# Patient Record
Sex: Female | Born: 2000 | Race: White | Hispanic: No | Marital: Single | Smoking: Current every day smoker
Health system: Southern US, Community
[De-identification: ages and names within clinical notes are randomized; demographics above are authoritative.]

## PROBLEM LIST (undated history)

## (undated) DIAGNOSIS — Q893 Situs inversus: Secondary | ICD-10-CM

## (undated) DIAGNOSIS — G43909 Migraine, unspecified, not intractable, without status migrainosus: Secondary | ICD-10-CM

## (undated) DIAGNOSIS — Q240 Dextrocardia: Secondary | ICD-10-CM

## (undated) HISTORY — PX: OTHER SURGICAL HISTORY: SHX169

## (undated) HISTORY — PX: NO PAST SURGERIES: SHX2092

---
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
## 2022-02-12 ENCOUNTER — Encounter: Payer: Self-pay | Admitting: Obstetrics

## 2022-02-12 ENCOUNTER — Ambulatory Visit (INDEPENDENT_AMBULATORY_CARE_PROVIDER_SITE_OTHER): Payer: Medicaid Other | Admitting: Obstetrics

## 2022-02-12 VITALS — BP 106/80 | Ht 65.0 in | Wt 120.0 lb

## 2022-02-12 DIAGNOSIS — Z3040 Encounter for surveillance of contraceptives, unspecified: Secondary | ICD-10-CM | POA: Diagnosis not present

## 2022-02-12 DIAGNOSIS — N921 Excessive and frequent menstruation with irregular cycle: Secondary | ICD-10-CM

## 2022-02-12 DIAGNOSIS — Z975 Presence of (intrauterine) contraceptive device: Secondary | ICD-10-CM | POA: Diagnosis not present

## 2022-02-12 DIAGNOSIS — A749 Chlamydial infection, unspecified: Secondary | ICD-10-CM | POA: Diagnosis not present

## 2022-02-12 DIAGNOSIS — Z113 Encounter for screening for infections with a predominantly sexual mode of transmission: Secondary | ICD-10-CM | POA: Diagnosis not present

## 2022-02-12 NOTE — Patient Instructions (Addendum)
Have a great year! Please call with any concerns. Don't forget to wear your seatbelt everyday! If you are not signed up on MyChart, please ask [place] how to sign up for it!   In a world where you can be anything, please be kind.   Most forms of birth control do not protect you from sexually transmitted infections (STIs). To protect against STIs, always use a latex condom.  Body mass index is 19.97 kg/m.  A Healthy Lifestyle: Care Instructions Your Care Instructions  A healthy lifestyle can help you feel good, stay at a healthy weight, and have plenty of energy for both work and play. A healthy lifestyle is something you can share with your whole family. A healthy lifestyle also can lower your risk for serious health problems, such as high blood pressure, heart disease, and diabetes. You can follow a few steps listed below to improve your health and the health of your family. Follow-up care is a key part of your treatment and safety. Be sure to make and go to all appointments, and call your doctor if you are having problems. It's also a good idea to know your test results and keep a list of the medicines you take. How can you care for yourself at home? Do not eat too much sugar, fat, or fast foods. You can still have dessert and treats now and then. The goal is moderation. Start small to improve your eating habits. Pay attention to portion sizes, drink less juice and soda pop, and eat more fruits and vegetables. Eat a healthy amount of food. A 3-ounce serving of meat, for example, is about the size of a deck of cards. Fill the rest of your plate with vegetables and whole grains. Limit the amount of soda and sports drinks you have every day. Drink more water when you are thirsty. Eat at least 5 servings of fruits and vegetables every day. It may seem like a lot, but it is not hard to reach this goal. A serving or helping is 1 piece of fruit, 1 cup of vegetables, or 2 cups of leafy, raw vegetables.  Have an apple or some carrot sticks as an afternoon snack instead of a candy bar. Try to have fruits and/or vegetables at every meal. Make exercise part of your daily routine. You may want to start with simple activities, such as walking, bicycling, or slow swimming. Try to be active 30 to 60 minutes every day. You do not need to do all 30 to 60 minutes all at once. For example, you can exercise 3 times a day for 10 or 20 minutes. Moderate exercise is safe for most people, but it is always a good idea to talk to your doctor before starting an exercise program. Keep moving. Mow the lawn, work in the garden, or [employer]. Take the stairs instead of the elevator at work. If you smoke, quit. People who smoke have an increased risk for heart attack, stroke, cancer, and other lung illnesses. Quitting is hard, but there are ways to boost your chance of quitting tobacco for good. Use nicotine gum, patches, or lozenges. Ask your doctor about stop-smoking programs and medicines. Keep trying. In addition to reducing your risk of diseases in the future, you will notice some benefits soon after you stop using tobacco. If you have shortness of breath or asthma symptoms, they will likely get better within a few weeks after you quit. Limit how much alcohol you drink. Moderate amounts of  alcohol (up to 2 drinks a day for men, 1 drink a day for women) are okay. But drinking too much can lead to liver problems, high blood pressure, and other health problems. Family health If you have a family, there are many things you can do together to improve your health. Eat meals together as a family as often as possible. Eat healthy foods. This includes fruits, vegetables, lean meats and dairy, and whole grains. Include your family in your fitness plan. Most people think of activities such as jogging or tennis as the way to fitness, but there are many ways you and your family can be more active. Anything that makes you  breathe hard and gets your heart pumping is exercise. Here are some tips: Walk to do errands or to take your child to school or the bus. Go for a family bike ride after dinner instead of watching TV. Care instructions adapted under license by your healthcare professional. This care instruction is for use with your licensed healthcare professional. If you have questions about a medical condition or this instruction, always ask your healthcare professional. [employer] any warranty or liability for your use of this information.

## 2022-02-12 NOTE — Progress Notes (Signed)
Chief Complaint  Patient presents with   Exposure to STD    Pt wants STD check.    Patient Monica [name] is an 21 y.o. year old G0P0000 No LMP recorded. Patient has had an implant. currently Nexplanon and orthoNOvum  for contraception who presents for STI check. She denies domestic violence or sexual abuse.  Patient denies vaginal discharge, odor, burning or itching. She reports she is using both contraceptives bc she would have continuosu break through bleeding with nexplanon and the pill was added for bleeding. Every time she stops bleeding recurs. Pt present with mother for visit, who answers most of questions.  Review of Systems  Constitutional:  Negative for activity change, appetite change, chills, diaphoresis, fatigue, fever and unexpected weight change.  HENT:  Negative for congestion, dental problem, drooling, ear discharge, ear pain, facial swelling, hearing loss, mouth sores, nosebleeds, postnasal drip, rhinorrhea, sinus pressure, sinus pain, sneezing, sore throat, tinnitus, trouble swallowing and voice change.   Eyes:  Negative for photophobia, pain, discharge, redness, itching and visual disturbance.  Respiratory:  Negative for apnea, cough, choking, chest tightness, shortness of breath, wheezing and stridor.   Cardiovascular:  Negative for chest pain, palpitations and leg swelling.  Gastrointestinal:  Negative for abdominal distention, abdominal pain, anal bleeding, blood in stool, constipation, diarrhea, nausea, rectal pain and vomiting.  Endocrine: Negative for cold intolerance, heat intolerance, polydipsia, polyphagia and polyuria.  Genitourinary:  Negative for decreased urine volume, difficulty urinating, dyspareunia, dysuria, enuresis, flank pain, frequency, genital sores, hematuria, menstrual problem, pelvic pain, urgency, vaginal bleeding, vaginal discharge and vaginal pain.  Musculoskeletal:  Negative for arthralgias, back pain, gait problem, joint swelling, myalgias, neck  pain and neck stiffness.  Skin:  Negative for color change, pallor, rash and wound.  Allergic/Immunologic: Negative for environmental allergies, food allergies and immunocompromised state.  Neurological:  Negative for dizziness, tremors, seizures, syncope, facial asymmetry, speech difficulty, weakness, light-headedness, numbness and headaches.  Hematological:  Negative for adenopathy. Does not bruise/bleed easily.  Psychiatric/Behavioral:  Negative for agitation, behavioral problems, confusion, decreased concentration, dysphoric mood, hallucinations, self-injury, sleep disturbance and suicidal ideas. The patient is not nervous/anxious and is not hyperactive.    Period Cycle (Days):  (amenorrhea on contraceptive) Dysmenorrhea: None  Past Medical History:  Diagnosis Date   Dextrocardia    Migraines    Situs inversus abdominalis    Past Surgical History:  Procedure Laterality Date   NO PAST SURGERIES     Family History  Problem Relation Age of Onset   Healthy Mother    Healthy Father    Healthy Sister    Healthy Brother    Early death Paternal Uncle    Social History   Socioeconomic History   Marital status: Single    Spouse name: Not on file   Number of children: Not on file   Years of education: Not on file   Highest education level: Not on file  Occupational History   Not on file  Tobacco Use   Smoking status: Never   Smokeless tobacco: Never  Vaping Use   Vaping Use: Every day  Substance and Sexual Activity   Alcohol use: No    Alcohol/week: 0.0 standard drinks   Drug use: No   Sexual activity: Yes    Birth control/protection: Implant, Pill  Other Topics Concern   Not on file  Social History Narrative   Not on file   Social Determinants of Health   Financial Resource Strain: Not on file  Food Insecurity:  Not on file  Transportation Needs: Not on file  Physical Activity: Not on file  Stress: Not on file  Social Connections: Not on file  Intimate Partner  Violence: Not on file   Medicine list and allergies reviewed and updated.    BP 106/80   Ht '5\' 5"'$  (1.651 m)   Wt 120 lb (54.4 kg)   BMI 19.97 kg/m  Physical Exam Vitals and nursing note reviewed.  Constitutional:      Appearance: Normal appearance.  HENT:     Head: Normocephalic and atraumatic.  Eyes:     Extraocular Movements: Extraocular movements intact.  Pulmonary:     Effort: Pulmonary effort is normal.  Musculoskeletal:        General: Normal range of motion.     Cervical back: Normal range of motion.  Skin:    General: Skin is warm and dry.  Neurological:     General: No focal deficit present.     Mental Status: She is alert and oriented to person, place, and time.  Psychiatric:        Mood and Affect: Mood normal.        Behavior: Behavior normal.        Thought Content: Thought content normal.   Assessment and Plan: Routine screening for STI (sexually transmitted infection) - Plan: Chlamydia/Gonococcus/Trichomonas, NAA  Surveillance of contraceptive implant  Breakthrough bleeding on Nexplanon  STI panel obtained via urine cultures Patient declines bloodwork Safe sexual practices encouraged. We have discussed that patient should be only nexplanon OR pill. They are not to be used together long term as that is increasing risk for clots/MI/Stroke. If the nexplanon is not working she should remove it and just use the pill if desired. Also Discussed that in general patient should be on lowest amount of estrogen possible to limit side effects, where as now she is on a 35 mcg pill which is the highest option possible.  They will discuss option and follow back up with [place].   Return if symptoms worsen or fail to improve, for annual/well woman.

## 2022-02-15 LAB — CHLAMYDIA/GONOCOCCUS/TRICHOMONAS, NAA
Chlamydia by NAA: POSITIVE — AB
Gonococcus by NAA: NEGATIVE
Trich vag by NAA: NEGATIVE

## 2022-02-16 ENCOUNTER — Telehealth: Payer: Self-pay

## 2022-02-16 DIAGNOSIS — A749 Chlamydial infection, unspecified: Secondary | ICD-10-CM

## 2022-02-16 MED ORDER — AZITHROMYCIN 1 G PO PACK: 1.0000 g | PACK | Freq: Once | ORAL | 0 refills | Status: AC

## 2022-02-16 NOTE — Telephone Encounter (Signed)
Pt's mom calling; has seen results one of which is positive; is rx needed?  Carlton, mom; she states pt is able to take azithromycin 1gm; pharm verified; adv will send in rx; no IC for 7d; use condome; partner needs tx - to see his PCP or HD; toc in 4wks.

## 2022-02-17 MED ORDER — DOXYCYCLINE HYCLATE 100 MG PO CAPS: 100.0000 mg | ORAL_CAPSULE | Freq: Two times a day (BID) | ORAL | 0 refills | Status: DC

## 2022-02-17 NOTE — Addendum Note (Signed)
Addended by: Heidi Dach on: 02/17/2022 10:27 AM   Modules accepted: Orders

## 2022-02-17 NOTE — Progress Notes (Signed)
Pt aware and April reached out to schedule her 6 week f/up.

## 2022-02-22 NOTE — Telephone Encounter (Signed)
Patient is schedule for 04/01/22 with [facility]

## 2022-03-15 ENCOUNTER — Other Ambulatory Visit: Payer: Self-pay | Admitting: Obstetrics and Gynecology

## 2022-03-18 ENCOUNTER — Ambulatory Visit: Payer: Medicaid Other | Admitting: Physician Assistant

## 2022-03-19 ENCOUNTER — Encounter: Payer: Self-pay | Admitting: Advanced Practice Midwife

## 2022-03-19 ENCOUNTER — Ambulatory Visit (INDEPENDENT_AMBULATORY_CARE_PROVIDER_SITE_OTHER): Payer: Medicaid Other | Admitting: Advanced Practice Midwife

## 2022-03-19 VITALS — BP 90/60 | Ht 65.0 in | Wt 115.0 lb

## 2022-03-19 DIAGNOSIS — Z3041 Encounter for surveillance of contraceptive pills: Secondary | ICD-10-CM

## 2022-03-19 DIAGNOSIS — Z3046 Encounter for surveillance of implantable subdermal contraceptive: Secondary | ICD-10-CM

## 2022-03-19 MED ORDER — NORETHIN ACE-ETH ESTRAD-FE 1-20 MG-MCG PO TABS: 1.0000 | ORAL_TABLET | Freq: Every day | ORAL | 11 refills | Status: DC

## 2022-03-19 NOTE — Progress Notes (Signed)
   GYNECOLOGY PROCEDURE NOTE  Nexplanon removal discussed in detail.  Risks of infection, bleeding, nerve injury all reviewed.  Patient understands risks and desires to proceed.  Verbal consent obtained.  Patient is certain she wants the Nexplanon removed.  She has had irregular and heavy bleeding while on Nexplanon. She was advised to use only one form of birth control to reduce risk of blood clots. See note from 02/12/22. We discussed all of that in more detail today and the increased risk for blood clots when using estrogen while using tobacco products. She prefers to continue with OCP and remove Nexplanon. She is encouraged to quit vaping. OCP changed to low dose estrogen. All questions answered.  Review of Systems  Constitutional:  Negative for chills and fever.  HENT:  Negative for congestion, ear discharge, ear pain, hearing loss, sinus pain and sore throat.   Eyes:  Negative for blurred vision and double vision.  Respiratory:  Negative for cough, shortness of breath and wheezing.   Cardiovascular:  Negative for chest pain, palpitations and leg swelling.  Gastrointestinal:  Negative for abdominal pain, blood in stool, constipation, diarrhea, heartburn, melena, nausea and vomiting.  Genitourinary:  Negative for dysuria, flank pain, frequency, hematuria and urgency.  Musculoskeletal:  Negative for back pain, joint pain and myalgias.  Skin:  Negative for itching and rash.  Neurological:  Negative for dizziness, tingling, tremors, sensory change, speech change, focal weakness, seizures, loss of consciousness, weakness and headaches.  Endo/Heme/Allergies:  Negative for environmental allergies. Does not bruise/bleed easily.       Positive for irregular/heavy bleeding  Psychiatric/Behavioral:  Negative for depression, hallucinations, memory loss, substance abuse and suicidal ideas. The patient is not nervous/anxious and does not have insomnia.    Vital Signs: BP 90/60   Ht '5\' 5"'$  (1.651 m)   Wt  115 lb (52.2 kg)   BMI 19.14 kg/m  Constitutional: Well nourished, well developed female in no acute distress.  HEENT: normal Skin: Warm and dry.  Extremity:  no edema   Respiratory:  Normal respiratory effort Neuro: DTRs 2+, Cranial nerves grossly intact Psych: Alert and Oriented x3. No memory deficits. Normal mood and affect.    Procedure: Patient placed in dorsal supine with left arm above head, elbow flexed at 90 degrees, arm resting on examination table.  Nexplanon identified without problems.  Betadine scrub x3.  1.5 ml of 1% lidocaine injected under Nexplanon device without problems.  Sterile gloves applied.  Small 0.5cm incision made at distal tip of Nexplanon device with 11 blade scalpel.  Nexplanon brought to incision and grasped with a small kelly clamp.  Nexplanon removed intact without problems.  Pressure applied to incision.  Hemostasis obtained.  Steri-strips applied, followed by bandage and compression dressing.  Patient tolerated procedure well.  No complications.   Assessment: 21 y.o. year old female now s/p uncomplicated Nexplanon removal.  Plan: 1.  Patient given post procedure precautions and asked to call for fever, chills, redness or drainage from her incision, bleeding from incision.  She understands she will likely have a small bruise near site of removal and can remove bandage tomorrow and steri-strips in approximately 1 week.  2) Contraception: Rx Tami Ribas, CNM [facility] 03/19/22, 2:12 PM    [id number] for lidocaine block, [phone] for nexplanon removal

## 2022-03-19 NOTE — Patient Instructions (Addendum)
Hormonal Contraception Information Hormonal contraception is a type of birth control that uses hormones to prevent pregnancy. It usually involves a combination of the hormones estrogen and progesterone, or only the hormone progesterone. Hormonal contraception works in these ways: It thickens the mucus in the cervix, which is the lowest part of the uterus. Thicker mucus makes it harder for sperm to enter the uterus. It changes the lining of the uterus. This makes it harder for an egg to attach or implant. It may stop the ovaries from releasing eggs (ovulation). Some women who take hormonal contraceptives that contain only progesterone may continue to ovulate. Hormonal contraception cannot prevent STIs (sexually transmitted infections). Pregnancy may still occur. Types of hormonal contraception  Estrogen and progesterone contraceptives Contraceptives that use a combination of estrogen and progesterone are available in these forms: Pill. Pills come in different combinations of hormones. Pills must be taken at the same time each day. They can affect your period. You can get your period monthly, once every 3 months, or not at all. Patch. The patch is applied to the buttocks, abdomen, upper outer arm, or back. It is kept in place for 3 weeks. It is removed for the last or fourth week of the cycle. Vaginal ring. The ring is placed in the vagina and left there for 3 weeks. It is then removed for the last or fourth week of the cycle. Progesterone-only contraceptives Contraceptives that use only progesterone are available in these forms: Pill. Pills should be taken at the same time everyday. This is very important to decrease the chance of pregnancy. Pills containing progestin-only are usually taken every day of the cycle. Other types of pills may have a placebo tablet for the last 4 days of every cycle. Intrauterine device (IUD). This device is inserted through the vagina and cervix into the uterus. It is  removed or replaced every 3 to 5 years, depending on the type. It can be removed sooner. Implant. Plastic rods are placed under the skin of the upper arm. They are removed or replaced every 3 years. They can be removed sooner. Shot (injection). The injection is given once every 12 or 13 weeks (about 3 months). Risks associated with hormonal contraception Estrogen and progesterone contraceptives can sometimes cause side effects, such as: Nausea. Headaches. Breast tenderness. Bleeding or spotting between menstrual cycles. High blood pressure (rare). Strokes, heart attacks, or blood clots (rare). Progesterone-only contraceptives also can have side effects, such as: Nausea. Headaches. Breast tenderness. Irregular menstrual bleeding. High blood pressure (rare). Talk to your health care provider about what side effects may mean for you. Questions to ask: What type of hormonal contraception is right for me? How long should I plan to use hormonal contraception? What are the side effects of the hormonal contraception method I choose? How can I prevent STIs while using hormonal contraception? Where to find more information Ask your health care provider for more information and resources about hormonal contraception. You can also go to: U.S. Department of Health and [employer], Office on Women's Health: [place] Summary Estrogen and progesterone are hormones used in many forms of birth control. Hormonal contraception cannot prevent STIs (sexually transmitted infections). Talk to your health care provider about what side effects may mean for you. Ask your health care provider for more information and resources about hormonal contraception. This information is not intended to replace advice given to you by your health care provider. Make sure you discuss any questions you have with your health care provider.  Document Revised: 05/14/2020 Document Reviewed: 05/14/2020 Elsevier  Patient Education  [place] of Quitting Smoking Quitting smoking is a physical and mental challenge. You may have cravings, withdrawal symptoms, and temptation to smoke. Before quitting, work with your health care provider to make a plan that can help you manage quitting. Making a plan before you quit may keep you from smoking when you have the urge to smoke while trying to quit. How to manage lifestyle changes Managing stress Stress can make you want to smoke, and wanting to smoke may cause stress. It is important to find ways to manage your stress. You could try some of the following: Practice relaxation techniques. Breathe slowly and deeply, in through your nose and out through your mouth. Listen to music. Soak in a bath or take a shower. Imagine a peaceful place or vacation. Get some support. Talk with family or friends about your stress. Join a support group. Talk with a counselor or therapist. Get some physical activity. Go for a walk, run, or bike ride. Play a favorite sport. Practice yoga.  Medicines Talk with your health care provider about medicines that might help you deal with cravings and make quitting easier for you. Relationships Social situations can be difficult when you are quitting smoking. To manage this, you can: Avoid parties and other social situations where people might be smoking. Avoid alcohol. Leave right away if you have the urge to smoke. Explain to your family and friends that you are quitting smoking. Ask for support and let them know you might be a bit grumpy. Plan activities where smoking is not an option. General instructions Be aware that many people gain weight after they quit smoking. However, not everyone does. To keep from gaining weight, have a plan in place before you quit, and stick to the plan after you quit. Your plan should include: Eating healthy snacks. When you have a craving, it may help to: Eat  popcorn, or try carrots, celery, or other cut vegetables. Chew sugar-free gum. Changing how you eat. Eat small portion sizes at meals. Eat 4-6 small meals throughout the day instead of 1-2 large meals a day. Be mindful when you eat. You should avoid watching television or doing other things that might distract you as you eat. Exercising regularly. Make time to exercise each day. If you do not have time for a long workout, do short bouts of exercise for 5-10 minutes several times a day. Do some form of strengthening exercise, such as weight lifting. Do some exercise that gets your heart beating and causes you to breathe deeply, such as walking fast, running, swimming, or biking. This is very important. Drinking plenty of water or other low-calorie or no-calorie drinks. Drink enough fluid to keep your urine pale yellow.  How to recognize withdrawal symptoms Your body and mind may experience discomfort as you try to get used to not having nicotine in your system. These effects are called withdrawal symptoms. They may include: Feeling hungrier than normal. Having trouble concentrating. Feeling irritable or restless. Having trouble sleeping. Feeling depressed. Craving a cigarette. These symptoms may surprise you, but they are normal to have when quitting smoking. To manage withdrawal symptoms: Avoid places, people, and activities that trigger your cravings. Remember why you want to quit. Get plenty of sleep. Avoid coffee and other drinks that contain caffeine. These may worsen some of your symptoms. How to manage cravings Come up with a plan for how to deal with  your cravings. The plan should include the following: A definition of the specific situation you want to deal with. An activity or action you will take to replace smoking. A clear idea for how this action will help. The name of someone who could help you with this. Cravings usually last for 5-10 minutes. Consider taking the  following actions to help you with your plan to deal with cravings: Keep your mouth busy. Chew sugar-free gum. Suck on hard candies or a straw. Brush your teeth. Keep your hands and body busy. Change to a different activity right away. Squeeze or play with a ball. Do an activity or a hobby, such as making bead jewelry, practicing needlepoint, or working with wood. Mix up your normal routine. Take a short exercise break. Go for a quick walk, or run up and down stairs. Focus on doing something kind or helpful for someone else. Call a friend or family member to talk during a craving. Join a support group. Contact a quitline. Where to find support To get help or find a support group: Call the [facility]'s Smoking Quitline: 1-800-QUIT-NOW 213-592-9639) Text QUIT to SmokefreeTXT: 673419 Where to find more information Visit these websites to find more information on quitting smoking: U.S. Department of Health and Human Services: www.smokefree.gov American Lung Association: www.freedomfromsmoking.org Centers for Disease Control and Prevention (CDC): http://www.wolf.info/ American Heart Association: www.heart.org Contact a health care provider if: You want to change your plan for quitting. The medicines you are taking are not helping. Your eating feels out of control or you cannot sleep. You feel depressed or become very anxious. Summary Quitting smoking is a physical and mental challenge. You will face cravings, withdrawal symptoms, and temptation to smoke again. Preparation can help you as you go through these challenges. Try different techniques to manage stress, handle social situations, and prevent weight gain. You can deal with cravings by keeping your mouth busy (such as by chewing gum), keeping your hands and body busy, calling family or friends, or contacting a quitline for people who want to quit smoking. You can deal with withdrawal symptoms by avoiding places where people  smoke, getting plenty of rest, and avoiding drinks that contain caffeine. This information is not intended to replace advice given to you by your health care provider. Make sure you discuss any questions you have with your health care provider. Document Revised: 08/28/2021 Document Reviewed: 08/28/2021 Elsevier Patient Education  [place].

## 2022-03-25 ENCOUNTER — Ambulatory Visit: Payer: Medicaid Other | Admitting: Obstetrics & Gynecology

## 2022-04-01 ENCOUNTER — Ambulatory Visit: Payer: Medicaid Other | Admitting: Obstetrics

## 2022-04-22 ENCOUNTER — Other Ambulatory Visit (HOSPITAL_COMMUNITY)
Admission: RE | Admit: 2022-04-22 | Discharge: 2022-04-22 | Disposition: A | Discharge status: Home / Self Care | Payer: Medicaid Other | Source: Ambulatory Visit | Attending: Advanced Practice Midwife | Admitting: Advanced Practice Midwife

## 2022-04-22 ENCOUNTER — Ambulatory Visit (INDEPENDENT_AMBULATORY_CARE_PROVIDER_SITE_OTHER): Payer: Medicaid Other

## 2022-04-22 DIAGNOSIS — N898 Other specified noninflammatory disorders of vagina: Secondary | ICD-10-CM | POA: Diagnosis present

## 2022-04-22 DIAGNOSIS — Z8619 Personal history of other infectious and parasitic diseases: Secondary | ICD-10-CM | POA: Insufficient documentation

## 2022-04-22 NOTE — Progress Notes (Signed)
Pt came in today for nurse visit. Missed her TOC for + chlamydia in May. Now having vaginal d/c. Not sure if partner was treated. Not using condoms.

## 2022-04-26 LAB — CERVICOVAGINAL ANCILLARY ONLY
Bacterial Vaginitis (gardnerella): NEGATIVE
Candida Glabrata: NEGATIVE
Candida Vaginitis: NEGATIVE
Chlamydia: NEGATIVE
Comment: NEGATIVE
Comment: NEGATIVE
Comment: NEGATIVE
Comment: NEGATIVE
Comment: NEGATIVE
Comment: NORMAL
Neisseria Gonorrhea: NEGATIVE
Trichomonas: NEGATIVE

## 2022-04-28 ENCOUNTER — Encounter: Payer: Self-pay | Admitting: Obstetrics

## 2022-10-27 ENCOUNTER — Emergency Department
Admission: EM | Admit: 2022-10-27 | Discharge: 2022-10-27 | Disposition: A | Discharge status: Home / Self Care | Payer: Medicaid Other | Attending: Emergency Medicine | Admitting: Emergency Medicine

## 2022-10-27 ENCOUNTER — Other Ambulatory Visit: Payer: Self-pay

## 2022-10-27 DIAGNOSIS — J101 Influenza due to other identified influenza virus with other respiratory manifestations: Secondary | ICD-10-CM | POA: Insufficient documentation

## 2022-10-27 DIAGNOSIS — Z1152 Encounter for screening for COVID-19: Secondary | ICD-10-CM | POA: Insufficient documentation

## 2022-10-27 LAB — RESP PANEL BY RT-PCR (RSV, FLU A&B, COVID)  RVPGX2
Influenza A by PCR: NEGATIVE
Influenza B by PCR: POSITIVE — AB
Resp Syncytial Virus by PCR: NEGATIVE
SARS Coronavirus 2 by RT PCR: NEGATIVE

## 2022-10-27 MED ORDER — ACETAMINOPHEN 500 MG PO TABS
1000.0000 mg | ORAL_TABLET | Freq: Once | ORAL | Status: AC
  Administered 2022-10-27: 1000 mg via ORAL
  Filled 2022-10-27: qty 2

## 2022-10-27 NOTE — ED Notes (Signed)
Pt states headaches, fevers, chills, bodyaches, weakness, stuffiness, cough, and nausea/vomitting

## 2022-10-27 NOTE — ED Provider Notes (Signed)
[facility] Provider Note    Event Date/Time   First MD Initiated Contact with Patient 10/27/22 1217     (approximate)   History   Fever and flu like symptoms   HPI  Monica [name] is a 22 y.o. female   presents to the ED by mother with complaint of cough, congestion, body aches, headache and fever for 3 days.  Patient possibly was exposed to flu.  Mother states that she did a home test for COVID which was negative.  Patient is vomiting or diarrhea other than associated posttussive.  Patient does vape and has a history of migraines and dextrocardia      Physical Exam   Triage Vital Signs: ED Triage Vitals  Enc Vitals Group     BP 10/27/22 1153 124/89     Pulse Rate 10/27/22 1153 (!) 128     Resp 10/27/22 1153 16     Temp 10/27/22 1153 (!) 101.3 F (38.5 C)     Temp Source 10/27/22 1153 Oral     SpO2 10/27/22 1153 99 %     Weight 10/27/22 1154 115 lb (52.2 kg)     Height 10/27/22 1154 '5\' 6"'$  (1.676 m)     Head Circumference --      Peak Flow --      Pain Score 10/27/22 1154 9     Pain Loc --      Pain Edu? --      Excl. in [place]? --     Most recent vital signs: Vitals:   10/27/22 1153 10/27/22 1353  BP: 124/89   Pulse: (!) 128   Resp: 16   Temp: (!) 101.3 F (38.5 C) 98.4 F (36.9 C)  SpO2: 99%      General: Awake, no distress.  CV:  Good peripheral perfusion.  Regular rate and rhythm. Resp:  Normal effort.  Lungs are clear bilaterally but congestive cough is noted during exam. Abd:  No distention.  Other:     ED Results / Procedures / Treatments   Labs (all labs ordered are listed, but only abnormal results are displayed) Labs Reviewed  RESP PANEL BY RT-PCR (RSV, FLU A&B, COVID)  RVPGX2 - Abnormal; Notable for the following components:      Result Value   Influenza B by PCR POSITIVE (*)    All other components within normal limits       PROCEDURES:  Critical Care performed:   Procedures   MEDICATIONS ORDERED  IN ED: Medications  acetaminophen (TYLENOL) tablet 1,000 mg (1,000 mg Oral Given 10/27/22 1240)     IMPRESSION / MDM / [place] / ED COURSE  I reviewed the triage vital signs and the nursing notes.   Differential diagnosis includes, but is not limited to, food, influenza, RSV, viral URI.  22 year old female is brought to the ED by mother with concerns of fever and flulike symptoms.  In triage patient did have a temperature of 101.3 was given Tylenol while in the ED.  Mother was made aware that her respiratory panel was positive for influenza B and patient's temperature was down to 98.4 prior to discharge.  She is continue to drink fluids frequently and continue with Tylenol and ibuprofen to control fever, body aches, headache.  A note for work was given and patient was given instructions to follow-up with her PCP if any continued problems.      Patient's presentation is most consistent with acute complicated illness / injury  requiring diagnostic workup.  FINAL CLINICAL IMPRESSION(S) / ED DIAGNOSES   Final diagnoses:  Influenza B     Rx / DC Orders   ED Discharge Orders     None        Note:  This document was prepared using Dragon voice recognition software and may include unintentional dictation errors.   Johnn Hai, PA-C 10/27/22 1358    Lavonia Drafts, MD 10/27/22 [phone]

## 2022-10-27 NOTE — Discharge Instructions (Addendum)
Follow-up with your primary care provider if any continued problems.  Continue to drink fluids frequently to stay hydrated.  Tylenol or ibuprofen can be alternated to control fever and help with body aches or headache. The flu is very contagious and has to run its course.  Over-the-counter medication can help with cough or congestion.

## 2022-10-27 NOTE — ED Triage Notes (Signed)
Pt started having symptoms on Sunday, cough, congestion, bodyaches and headache

## 2023-02-11 ENCOUNTER — Other Ambulatory Visit: Payer: Self-pay | Admitting: Advanced Practice Midwife

## 2023-02-11 DIAGNOSIS — Z3041 Encounter for surveillance of contraceptive pills: Secondary | ICD-10-CM

## 2023-02-16 ENCOUNTER — Telehealth: Payer: Self-pay

## 2023-02-16 ENCOUNTER — Other Ambulatory Visit: Payer: Self-pay | Admitting: Advanced Practice Midwife

## 2023-02-16 DIAGNOSIS — Z3041 Encounter for surveillance of contraceptive pills: Secondary | ICD-10-CM

## 2023-02-16 NOTE — Telephone Encounter (Signed)
Pt calling; needs refill on her bc; pharmacy said they do not have any more refills for her.  [phone]

## 2023-02-17 MED ORDER — NORETHIN ACE-ETH ESTRAD-FE 1-20 MG-MCG PO TABS: 1.0000 | ORAL_TABLET | Freq: Every day | ORAL | 2 refills | Status: DC

## 2023-02-17 NOTE — Telephone Encounter (Signed)
Pt aware refill eRx'd. 

## 2023-02-17 NOTE — Telephone Encounter (Signed)
Pt is scheduled for her annual on July 23 at 1:55 with [facility].  Will you please send in the refill for this pt?

## 2023-02-17 NOTE — Telephone Encounter (Signed)
The patient is requesting prescription to be sent to [facility] in [place], [place] [address]

## 2023-02-23 ENCOUNTER — Other Ambulatory Visit (HOSPITAL_COMMUNITY)
Admission: RE | Admit: 2023-02-23 | Discharge: 2023-02-23 | Disposition: A | Discharge status: Home / Self Care | Payer: Medicaid Other | Source: Ambulatory Visit | Attending: Obstetrics and Gynecology | Admitting: Obstetrics and Gynecology

## 2023-02-23 ENCOUNTER — Ambulatory Visit (INDEPENDENT_AMBULATORY_CARE_PROVIDER_SITE_OTHER): Payer: Medicaid Other

## 2023-02-23 VITALS — BP 125/86 | HR 96 | Wt 117.4 lb

## 2023-02-23 DIAGNOSIS — N898 Other specified noninflammatory disorders of vagina: Secondary | ICD-10-CM | POA: Insufficient documentation

## 2023-02-23 NOTE — Patient Instructions (Signed)

## 2023-02-23 NOTE — Progress Notes (Signed)
    NURSE VISIT NOTE  Subjective:    Patient ID: Huey Bienenstock, female    DOB: 2001-01-16, 22 y.o.   MRN: [id number]  HPI  Patient is a 22 y.o. G0P0000 female who presents for white vaginal discharge for 6 day(s).Denies any odor.Denies abnormal vaginal bleeding or significant pelvic pain or fever. Patient has history of known exposure to STD.   Objective:    BP 125/86   Pulse 96   Wt 117 lb 6.4 oz (53.3 kg)   BMI 18.95 kg/m      Assessment:   1. Vaginal discharge       Plan:   GC and chlamydia DNA  probe sent to lab. Treatment: waiting on results ROV prn if symptoms persist or worsen.   Burtis Junes, CMA

## 2023-02-24 LAB — CERVICOVAGINAL ANCILLARY ONLY
Bacterial Vaginitis (gardnerella): POSITIVE — AB
Candida Glabrata: NEGATIVE
Candida Vaginitis: POSITIVE — AB
Chlamydia: POSITIVE — AB
Comment: NEGATIVE
Comment: NEGATIVE
Comment: NEGATIVE
Comment: NEGATIVE
Comment: NEGATIVE
Comment: NORMAL
Neisseria Gonorrhea: NEGATIVE
Trichomonas: NEGATIVE

## 2023-02-25 ENCOUNTER — Other Ambulatory Visit: Payer: Self-pay | Admitting: Licensed Practical Nurse

## 2023-02-25 ENCOUNTER — Telehealth: Payer: Self-pay

## 2023-02-25 DIAGNOSIS — B9689 Other specified bacterial agents as the cause of diseases classified elsewhere: Secondary | ICD-10-CM

## 2023-02-25 DIAGNOSIS — R112 Nausea with vomiting, unspecified: Secondary | ICD-10-CM

## 2023-02-25 DIAGNOSIS — B3731 Acute candidiasis of vulva and vagina: Secondary | ICD-10-CM

## 2023-02-25 DIAGNOSIS — A749 Chlamydial infection, unspecified: Secondary | ICD-10-CM

## 2023-02-25 MED ORDER — ONDANSETRON HCL 4 MG PO TABS: 4.0000 mg | ORAL_TABLET | Freq: Three times a day (TID) | ORAL | 0 refills | Status: DC | PRN

## 2023-02-25 MED ORDER — AZITHROMYCIN 500 MG PO TABS: 1000.0000 mg | ORAL_TABLET | Freq: Once | ORAL | 0 refills | Status: AC

## 2023-02-25 MED ORDER — METRONIDAZOLE 500 MG PO TABS: 500.0000 mg | ORAL_TABLET | Freq: Two times a day (BID) | ORAL | 0 refills | Status: DC

## 2023-02-25 MED ORDER — FLUCONAZOLE 150 MG PO TABS: 150.0000 mg | ORAL_TABLET | ORAL | 0 refills | Status: AC

## 2023-02-25 NOTE — Telephone Encounter (Signed)
Pt's mom (on DPR) calling; pt has taken the medication for the chlamydia and started taking the metronidazole 500mg ; is having vomiting.  Adv will send msg to LMD.  [phone]

## 2023-02-25 NOTE — Progress Notes (Signed)
Pt took Azithromycin and then started flagyl PO, she vomited 30 mins later. She did not see any pills when she vomited.  Offered switching to Metrogel or a script for Zofran Pt desires script for Zofran, take Zofran 1 hour prior to taking the Flagyl, if needed Carie Caddy, Ina Homes  [facility]  02/25/23  4:56 PM

## 2023-02-25 NOTE — Progress Notes (Signed)
TC to Monica [name] Reviewed lab result, you have a yeast infection, BV, and chlamydia Reviewed mode of transmission for chlamydia, understands it is a STI and her partner(s) needs to be treated, she should not have IC for 7 days after taking the medication and always use a condom.  Pt prefers oral medications, she is able to abstain from alcohol while on Flagyl.  Script sent to pharmacy Carie Caddy, CNM  [facility]  02/25/23  8:50 AM

## 2023-03-27 ENCOUNTER — Ambulatory Visit
Admission: EM | Admit: 2023-03-27 | Discharge: 2023-03-27 | Disposition: A | Discharge status: Home / Self Care | Payer: 59 | Attending: Urgent Care | Admitting: Urgent Care

## 2023-03-27 DIAGNOSIS — M940 Chondrocostal junction syndrome [Tietze]: Secondary | ICD-10-CM | POA: Diagnosis not present

## 2023-03-27 DIAGNOSIS — R0789 Other chest pain: Secondary | ICD-10-CM | POA: Diagnosis not present

## 2023-03-27 DIAGNOSIS — M546 Pain in thoracic spine: Secondary | ICD-10-CM

## 2023-03-27 MED ORDER — CYCLOBENZAPRINE HCL 5 MG PO TABS: 5.0000 mg | ORAL_TABLET | Freq: Three times a day (TID) | ORAL | 0 refills | Status: DC | PRN

## 2023-03-27 NOTE — ED Provider Notes (Signed)
Renaldo Fiddler    CSN: [id number] Arrival date & time: 03/27/23  1202      History   Chief Complaint Chief Complaint  Patient presents with   Back Pain   Neck Pain   Headache    HPI Monica [name] is a 22 y.o. female.    Back Pain Associated symptoms: headaches   Neck Pain Associated symptoms: headaches   Headache Associated symptoms: back pain and neck pain     Accompanied by her mother.  Presents with complaint of back pain that began yesterday.  She endorses new chest pain beginning today.  Denies known injury or precipitating event.  Denies history of similar symptoms.    She reports associated symptom of headache which is relieved with Excedrin.  Back pain is mid thoracic and cephalic.  PMH includes dextrocardia.  She endorses no ongoing sequela from this condition.  Reports new job at [employer] and "on my feet" all day.  Denies significant lifting or new activities.  Previously working at [employer] with similar responsibilities.  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 Authorizing Provider  metroNIDAZOLE (FLAGYL) 500 MG tablet Take 1 tablet (500 mg total) by mouth 2 (two) times daily. 02/25/23   Dominic, Courtney Heys, CNM  norethindrone-ethinyl estradiol-FE (BLISOVI FE  1/20) 1-20 MG-MCG tablet Take 1 tablet by mouth daily. 02/17/23   Copland, Helmut Muster B, PA-C  ondansetron (ZOFRAN) 4 MG tablet Take 1 tablet (4 mg total) by mouth every 8 (eight) hours as needed for nausea or vomiting. 02/25/23 02/25/24  Dominic, Courtney Heys, CNM  valACYclovir (VALTREX) 1000 MG tablet Take 1 tablet (1,000 mg total) by mouth daily. 06/22/21   Copland, Ilona Sorrel, PA-C    Family History Family History  Problem Relation Age of Onset   Healthy Mother    Healthy Father    Healthy Sister    Healthy Brother    Breast cancer Maternal Grandmother 34   Early death Paternal Uncle    Breast cancer Maternal Great-grandmother 33   Rectal cancer Maternal Great-grandmother 81    Social History Social History   Tobacco Use   Smoking status: Every Day    Types: E-cigarettes   Smokeless tobacco: Never  Vaping Use   Vaping Use: Every day  Substance Use Topics   Alcohol use: Yes   Drug use: Yes    Types: Marijuana     Allergies   Patient has no known allergies.   Review of Systems Review  of Systems  Musculoskeletal:  Positive for back pain and neck pain.  Neurological:  Positive for headaches.     Physical Exam Triage Vital Signs ED Triage Vitals  Enc Vitals Group     BP 03/27/23 1252 104/68     Pulse Rate 03/27/23 1252 (!) 102     Resp 03/27/23 1252 16     Temp 03/27/23 1252 99.2 F (37.3 C)     Temp Source 03/27/23 1252 Oral     SpO2 03/27/23 1252 97 %     Weight --      Height --      Head Circumference --      Peak Flow --      Pain Score 03/27/23 1251 8     Pain Loc --      Pain Edu? --      Excl. in [place]? --    No data found.  Updated Vital Signs BP 104/68 (BP Location: Left Arm)   Pulse (!) 102   Temp 99.2 F (37.3 C) (Oral)   Resp 16   LMP 03/18/2023 (Approximate)   SpO2 97%   Visual Acuity Right Eye Distance:   Left Eye Distance:   Bilateral Distance:    Right Eye Near:   Left Eye Near:    Bilateral Near:     Physical Exam Vitals reviewed.   Constitutional:      Appearance: She is well-developed.  Cardiovascular:     Rate and Rhythm: Normal rate and regular rhythm.     Heart sounds: Normal heart sounds.  Chest:     Chest wall: Tenderness present.    Musculoskeletal:        General: Normal range of motion.     Thoracic back: Spasms and bony tenderness present.     Comments: Thoracic wall tenderness with palpation.  Neurological:     Mental Status: She is alert and oriented to person, place, and time.  Psychiatric:        Mood and Affect: Mood normal.        Behavior: Behavior normal.      UC Treatments / Results  Labs (all labs ordered are listed, but only abnormal results are displayed) Labs Reviewed - No data to display  EKG   Radiology No results found.  Procedures Procedures (including critical care time)  Medications Ordered in UC Medications - No data to display  Initial Impression / Assessment and Plan / UC Course  I have reviewed the triage vital signs and the nursing notes.  Pertinent labs & imaging results that were available during my care of the patient were reviewed by me and considered in my medical decision making (see chart for details).   Monica [name] is a 22 y.o. female presenting with mid and upper posterior thoracic and chest wall tenderness. Patient is afebrile without recent antipyretics, satting well on room air. Overall is well appearing though non-toxic, well hydrated, without respiratory distress. Pulmonary exam is unremarkable.  Lungs CTAB without wheezing, rhonchi, rales. RRR without murmurs, rubs, gallops. Auscultating heart sounds on the right side of her chest.  Chest wall tenderness with palpation.  Some bony tenderness in her posterior thoracic wall.  Reviewed relevant chart history. Additional history obtained from patient family/caregiver present during the exam.  Suspect muscle spasms as the cause of her symptoms versus costochondritis.  Recommending use of NSAID  medication for symptom control.  Will prescribe muscle relaxant.  Warned patient regarding sedation.  Counseled patient on  potential for adverse effects with medications prescribed/recommended today, ER and return-to-clinic precautions discussed, patient verbalized understanding and agreement with care plan.  Final Clinical Impressions(s) / UC Diagnoses   Final diagnoses:  None   Discharge Instructions   None    ED Prescriptions   None    PDMP not reviewed this encounter.   Charma Igo, [place] 03/27/23 1319

## 2023-03-27 NOTE — ED Triage Notes (Signed)
Pt c/o back and neck (mid back and up) pain that began yesterday. The patient states she now has non radiating chest pain that began today (is exacerbated when inhaling).   Home interventions; epsom salt   The patient states she does not recall doing anything to cause pain.

## 2023-03-27 NOTE — Discharge Instructions (Signed)
Recommend using ibuprofen or naproxen to relieve your back and chest wall pain.  I have prescribed a muscle relaxant which should also help.  Heat therapy is likely to be helpful.  If your symptoms worsen, recommend evaluation in the ED.

## 2023-04-12 ENCOUNTER — Ambulatory Visit (INDEPENDENT_AMBULATORY_CARE_PROVIDER_SITE_OTHER): Payer: 59 | Admitting: Obstetrics and Gynecology

## 2023-04-12 ENCOUNTER — Encounter: Payer: Self-pay | Admitting: Obstetrics and Gynecology

## 2023-04-12 ENCOUNTER — Other Ambulatory Visit (HOSPITAL_COMMUNITY)
Admission: RE | Admit: 2023-04-12 | Discharge: 2023-04-12 | Disposition: A | Discharge status: Home / Self Care | Payer: 59 | Source: Ambulatory Visit | Attending: Obstetrics and Gynecology | Admitting: Obstetrics and Gynecology

## 2023-04-12 VITALS — BP 98/70 | Ht 65.0 in | Wt 112.0 lb

## 2023-04-12 DIAGNOSIS — Z01419 Encounter for gynecological examination (general) (routine) without abnormal findings: Secondary | ICD-10-CM

## 2023-04-12 DIAGNOSIS — Z3041 Encounter for surveillance of contraceptive pills: Secondary | ICD-10-CM

## 2023-04-12 DIAGNOSIS — Z113 Encounter for screening for infections with a predominantly sexual mode of transmission: Secondary | ICD-10-CM | POA: Insufficient documentation

## 2023-04-12 MED ORDER — NORETHIN ACE-ETH ESTRAD-FE 1-20 MG-MCG PO TABS: 1.0000 | ORAL_TABLET | Freq: Every day | ORAL | 3 refills | Status: DC

## 2023-04-12 NOTE — Patient Instructions (Signed)
I value your feedback and you entrusting us with your care. If you get a [facility] patient survey, I would appreciate you taking the time to let us know about your experience today. Thank you! ? ? ?

## 2023-04-12 NOTE — Progress Notes (Signed)
PCP:  [facility]   Chief Complaint  Patient presents with   Gynecologic Exam    No concerns     HPI:      [name] is a 22 y.o. G0P0000 whose LMP was Patient's last menstrual period was 04/09/2023 (approximate)., presents today for her annual examination.  Her menses are regular every 28-30 days, lasting 7 days, light flow usually.  Dysmenorrhea mild, no BTB.  Sex activity: single partner, contraception - OCP (estrogen/progesterone). No pain/bleeding. Last Pap: N/A due to age Hx of STDs: GC, chlamydia in past  There is a FH of breast cancer in her MGM and MGGM, genetic testing not indicated. There is no FH of ovarian cancer. The patient does not do self-breast exams.  Tobacco use: vapes daily Alcohol use: social drinker Daily MJ drug use.  Exercise: min active  She does get adequate calcium but not Vitamin D in her diet.  Hx of cold sores, occasional now. Were frequent and took Valtrex 1 g daily as preventive. Now takes prn. Doesn't need RF currently.   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   Migraine without aura and responsive to treatment 06/12/2015    Past Surgical History:  Procedure Laterality Date   OTHER SURGICAL HISTORY     Arm Surgery    Family History  Problem Relation Age of Onset   Healthy Mother    Healthy Father    Healthy Sister    Healthy Brother    Breast cancer Maternal Grandmother 76   Early death Paternal Uncle    Breast cancer Maternal Great-grandmother 22   Rectal cancer Maternal Great-grandmother 57    Social History   Socioeconomic History   Marital status: Single    Spouse name: Not on file   Number of children: Not on file   Years of education: Not on file   Highest education level: Not on file  Occupational History   Not on file   Tobacco Use   Smoking status: Every Day    Types: E-cigarettes   Smokeless tobacco: Never  Vaping Use   Vaping status: Every Day  Substance and Sexual Activity   Alcohol use: Yes   Drug use: Yes    Types: Marijuana   Sexual activity: Yes    Birth control/protection: Pill  Other Topics Concern   Not on file  Social History Narrative   Not on file   Social Determinants of Health   Financial Resource Strain: Not on file  Food Insecurity: Not on file  Transportation Needs: Not on file  Physical Activity: Not on file  Stress: Not on file  Social Connections: Not on file  Intimate Partner Violence: Not on file     Current Outpatient Medications:    cyclobenzaprine (FLEXERIL) 5 MG tablet, Take 1 tablet (5 mg total) by mouth 3 (three) times daily as needed for muscle spasms. Medication will cause sedation. Do not drive or operate equipment while taking., Disp: 12 tablet, Rfl: 0   valACYclovir (VALTREX) 1000 MG tablet, Take 1 tablet (1,000 mg total) by mouth daily., Disp: 30 tablet, Rfl: 2   norethindrone-ethinyl estradiol-FE (BLISOVI FE 1/20) 1-20 MG-MCG tablet, Take 1 tablet by mouth daily., Disp: 84 tablet, Rfl: 3     ROS:  Review of Systems  Constitutional:  Negative  for fatigue, fever and unexpected weight change.  Respiratory:  Negative for cough, shortness of breath and wheezing.   Cardiovascular:  Negative for chest pain, palpitations and leg swelling.  Gastrointestinal:  Negative for blood in stool, constipation, diarrhea, nausea and vomiting.  Endocrine: Negative for cold intolerance, heat intolerance and polyuria.  Genitourinary:  Negative for dyspareunia, dysuria, flank pain, frequency, genital sores, hematuria, menstrual problem, pelvic pain, urgency, vaginal bleeding, vaginal discharge and vaginal pain.  Musculoskeletal:  Negative for back pain, joint swelling and myalgias.  Skin:  Negative for rash.  Neurological:  Negative for dizziness, syncope,  light-headedness, numbness and headaches.  Hematological:  Negative for adenopathy.  Psychiatric/Behavioral:  Positive for agitation and dysphoric mood. Negative for confusion, sleep disturbance and suicidal ideas. The patient is not nervous/anxious.    BREAST: No symptoms   Objective: BP 98/70   Ht 5\' 5"  (1.651 m)   Wt 112 lb (50.8 kg)   LMP 04/09/2023 (Approximate)   BMI 18.64 kg/m    Physical Exam Constitutional:      Appearance: She is well-developed.  Genitourinary:     Vulva normal.     Right Labia: No rash, tenderness or lesions.    Left Labia: No tenderness, lesions or rash.    No vaginal discharge, erythema or tenderness.      Right Adnexa: not tender and no mass present.    Left Adnexa: not tender and no mass present.    No cervical friability or polyp.     Uterus is not enlarged or tender.  Breasts:    Right: No mass, nipple discharge, skin change or tenderness.     Left: No mass, nipple discharge, skin change or tenderness.  Neck:     Thyroid: No thyromegaly.  Cardiovascular:     Rate and Rhythm: Normal rate and regular rhythm.     Heart sounds: Normal heart sounds. No murmur heard. Pulmonary:     Effort: Pulmonary effort is normal.     Breath sounds: Normal breath sounds.  Abdominal:     Palpations: Abdomen is soft.     Tenderness: There is no abdominal tenderness. There is no guarding or rebound.  Musculoskeletal:        General: Normal range of motion.     Cervical back: Normal range of motion.  Lymphadenopathy:     Cervical: No cervical adenopathy.  Neurological:     General: No focal deficit present.     Mental Status: She is alert and oriented to person, place, and time.     Cranial Nerves: No cranial nerve deficit.  Skin:    General: Skin is warm and dry.  Psychiatric:        Mood and Affect: Mood normal.        Behavior: Behavior normal.        Thought Content: Thought content normal.        Judgment: Judgment normal.  Vitals reviewed.     Assessment/Plan: Encounter for annual routine gynecological examination  Screening for STD (sexually transmitted disease) - Plan: Cervicovaginal ancillary only  Encounter for surveillance of contraceptive pills - Plan: norethindrone-ethinyl estradiol-FE (BLISOVI FE 1/20) 1-20 MG-MCG tablet; OCP RF  Meds ordered this encounter  Medications   norethindrone-ethinyl estradiol-FE (BLISOVI FE 1/20) 1-20 MG-MCG tablet    Sig: Take 1 tablet by mouth daily.    Dispense:  84 tablet    Refill:  3    Order Specific Question:   Supervising Provider    Answer:  CHERRY, ANIKA [[id number]]             GYN counsel adequate intake of calcium and vitamin D, diet and exercise; d/c vaping and MJ use     F/U  Return in about 1 year (around 04/11/2024).  Monica Edberg B. Aireal Slater, PA-C 04/12/2023 2:33 PM

## 2023-04-14 LAB — CERVICOVAGINAL ANCILLARY ONLY
Chlamydia: NEGATIVE
Comment: NEGATIVE
Comment: NEGATIVE
Comment: NORMAL
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
## 2023-06-06 ENCOUNTER — Other Ambulatory Visit (HOSPITAL_COMMUNITY)
Admission: RE | Admit: 2023-06-06 | Discharge: 2023-06-06 | Disposition: A | Discharge status: Home / Self Care | Payer: 59 | Source: Ambulatory Visit | Attending: Certified Nurse Midwife | Admitting: Certified Nurse Midwife

## 2023-06-06 ENCOUNTER — Ambulatory Visit (INDEPENDENT_AMBULATORY_CARE_PROVIDER_SITE_OTHER): Payer: 59

## 2023-06-06 VITALS — BP 123/90 | HR 75 | Wt 121.5 lb

## 2023-06-06 DIAGNOSIS — Z113 Encounter for screening for infections with a predominantly sexual mode of transmission: Secondary | ICD-10-CM | POA: Insufficient documentation

## 2023-06-06 NOTE — Progress Notes (Signed)
NURSE VISIT NOTE  Subjective:    Patient ID: Monica [name], female    DOB: 2001/01/15, 22 y.o.   MRN: [id number]  HPI  Patient is a 22 y.o. G33P0000 female who presents for normal vaginal discharge. Denies abnormal vaginal bleeding or significant pelvic pain or fever. denies  Patient has history of known exposure to STD.   Objective:    Wt 121 lb 8 oz (55.1 kg)   LMP 06/01/2023 (Approximate)   BMI 20.22 kg/m    @THIS  VISIT ONLY@  Assessment:   1. Screening for STD (sexually transmitted disease)      Plan:   GC and chlamydia DNA  probe sent to lab. Treatment: abstain from coitus during course of treatment ROV prn if symptoms persist or worsen.   Burtis Junes, CMA

## 2023-08-05 ENCOUNTER — Ambulatory Visit (INDEPENDENT_AMBULATORY_CARE_PROVIDER_SITE_OTHER): Payer: 59

## 2023-08-05 ENCOUNTER — Other Ambulatory Visit (HOSPITAL_COMMUNITY)
Admission: RE | Admit: 2023-08-05 | Discharge: 2023-08-05 | Disposition: A | Discharge status: Home / Self Care | Payer: 59 | Source: Ambulatory Visit | Attending: Obstetrics | Admitting: Obstetrics

## 2023-08-05 VITALS — BP 90/50 | Ht 65.0 in | Wt 122.1 lb

## 2023-08-05 DIAGNOSIS — Z113 Encounter for screening for infections with a predominantly sexual mode of transmission: Secondary | ICD-10-CM | POA: Diagnosis not present

## 2023-08-05 DIAGNOSIS — N898 Other specified noninflammatory disorders of vagina: Secondary | ICD-10-CM

## 2023-08-05 DIAGNOSIS — Z202 Contact with and (suspected) exposure to infections with a predominantly sexual mode of transmission: Secondary | ICD-10-CM

## 2023-08-05 NOTE — Progress Notes (Signed)
    NURSE VISIT NOTE  Subjective:    Patient ID: Monica [name], female    DOB: [birth year], 22 y.o.   MRN: [id number]  HPI  Patient is a 22 y.o. G44P0000 female who presents for no vaginal discharge. Denies abnormal vaginal bleeding or significant pelvic pain or fever. denies dysuria, hematuria, urinary frequency, urinary urgency, abdominal pain, pelvic pain, genital rash, genital irritation, and vaginal discharge. Patient admits to history of known exposure to STD. She has a new partner and would like to be screened for any new stds.   Objective:    Ht 5\' 5"  (1.651 m)   Wt 122 lb 1.6 oz (55.4 kg)   LMP 07/29/2023 (Approximate)   BMI 20.32 kg/m    @THIS  VISIT ONLY@  Assessment:   1. Screen for STD (sexually transmitted disease)     trichomonas and rule out GC or chlamydia  Plan:   GC and chlamydia DNA  probe sent to lab. Treatment: abstain from coitus during course of treatment ROV prn if symptoms persist or worsen.     Santiago Bumpers, CMA [facility]

## 2023-08-05 NOTE — Patient Instructions (Signed)
Preventing Sexually Transmitted Infections, Adult Sexually transmitted infections (STIs) are spread from person to person (are contagious). They are spread, or transmitted, during sex. The sex may be vaginal, anal, or oral. STIs can be passed during sexual contact with skin, genitals, mouth, or rectum. They may spread through body fluids, such as saliva, semen, blood, vaginal mucus, and urine. STIs are very common. They can happen in people of all ages. Some common STIs are: Herpes. Hepatitis B. Chlamydia. Gonorrhea. Syphilis. Trichomoniasis. Human papillomavirus (HPV). Human immunodeficiency virus (HIV). This can cause acquired immunodeficiency syndrome (AIDS). How can STIs affect me? You may not have symptoms with an STI. Even if you do not have symptoms, you can still spread the infection to others. You also still need treatment. STIs can be treated. Some STIs can be cured. Other STIs cannot be cured and will affect you for the rest of your life. Certain STIs may: Require you to take medicine for the rest of your life. Affect your ability to have children. Increase your risk for getting other STIs. Increase your risk of getting certain conditions. These may include: Cervical cancer. Pelvic inflammatory disease (PID). Organ damage or damage to other parts of your body. This can happen if the infection spreads. Cause problems during pregnancy. STIs may be spread to the baby during pregnancy or birth. Females tend to have more severe problems from STIs than males. What can increase my risk? You may be more at risk for an STI if: You do not use protection during sex. You have more than one sex partner. You have a sex partner who has other sex partners. You have sex with a person who has an STI. You have an STI, or you have had an STI before. You inject drugs or have a sex partner who injects drugs. What actions can I take to prevent STIs? The only way to fully prevent STIs is not to  have sex of any kind. This is called practicing abstinence. If you are sexually active, you can protect yourself and others by taking these actions to lower your risk of getting an STI: Lifestyle Have only one sex partner or limit the number of sex partners you have. Avoid having sex after you have alcohol or drugs. Alcohol and drugs can affect your ability to make good choices. This can lead to risky sexual behaviors. Go to prevention counseling. This can teach you how to avoid getting an STI. Barrier protection  Use methods to stop body fluids from being exchanged between partners during sex (barrier protection). These methods can be used during oral, vaginal, or anal sex. They include: External condom, for males. Internal condom, for females. Dental dam. Use a new barrier method for every sex act from start to finish. Know that a barrier method may not protect you from all STIs. Some STIs, such as herpes, are spread through skin-to-skin contact. Avoid all sexual contact if you or a partner has herpes and there is an active flare with open sores. Birth control pills, injections, implants, and intrauterine devices (IUDs) do not protect against STIs. To prevent both STIs and pregnancy, always use a condom with a second form of birth control. General information Ask your health care provider about taking pre-exposure prophylaxis (PrEP) to prevent HIV. Stay up to date on your vaccines. Some vaccines can lower your risk of getting certain STIs. These include: Hepatitis B vaccine. HPV vaccine. This is recommended for people up to age 26. Get tested for STIs. Have your   partners get tested, too. If you test positive for an STI, follow recommendations from your health care provider about treatment. Make sure your sex partners are tested and treated as well. Where to find more information Learn more about STIs from: Centers for Disease Control and Prevention (CDC): More information about certain  STIs: cdc.gov Places to get sexual health counseling and treatment for free or at a low cost: gettested.cdc.gov U.S. Department of Health and Human Services (HHS): womenshealth.gov This information is not intended to replace advice given to you by your health care provider. Make sure you discuss any questions you have with your health care provider. Document Revised: 09/16/2022 Document Reviewed: 02/19/2022 Elsevier Patient Education  2024 Elsevier Inc.  

## 2023-08-06 LAB — HIV ANTIBODY (ROUTINE TESTING W REFLEX): HIV Screen 4th Generation wRfx: NONREACTIVE

## 2023-08-06 LAB — RPR: RPR Ser Ql: NONREACTIVE

## 2023-08-09 LAB — CERVICOVAGINAL ANCILLARY ONLY
Bacterial Vaginitis (gardnerella): NEGATIVE
Candida Glabrata: NEGATIVE
Candida Vaginitis: NEGATIVE
Chlamydia: NEGATIVE
Comment: NEGATIVE
Comment: NEGATIVE
Comment: NEGATIVE
Comment: NEGATIVE
Comment: NEGATIVE
Comment: NORMAL
Neisseria Gonorrhea: NEGATIVE
Trichomonas: NEGATIVE

## 2023-08-25 DIAGNOSIS — H5213 Myopia, bilateral: Secondary | ICD-10-CM | POA: Diagnosis not present

## 2024-02-23 ENCOUNTER — Other Ambulatory Visit: Payer: Self-pay

## 2024-02-23 ENCOUNTER — Emergency Department

## 2024-02-23 ENCOUNTER — Emergency Department: Admission: EM | Admit: 2024-02-23 | Discharge: 2024-02-23 | Disposition: A | Discharge status: Home / Self Care

## 2024-02-23 ENCOUNTER — Encounter: Payer: Self-pay | Admitting: Emergency Medicine

## 2024-02-23 DIAGNOSIS — N939 Abnormal uterine and vaginal bleeding, unspecified: Secondary | ICD-10-CM | POA: Insufficient documentation

## 2024-02-23 DIAGNOSIS — R1030 Lower abdominal pain, unspecified: Secondary | ICD-10-CM | POA: Diagnosis present

## 2024-02-23 LAB — WET PREP, GENITAL
Clue Cells Wet Prep HPF POC: NONE SEEN
Sperm: NONE SEEN
Trich, Wet Prep: NONE SEEN
WBC, Wet Prep HPF POC: <10 (ref <10)
Yeast Wet Prep HPF POC: NONE SEEN

## 2024-02-23 LAB — URINALYSIS, ROUTINE W REFLEX MICROSCOPIC
Bilirubin Urine: NEGATIVE
Glucose, UA: NEGATIVE mg/dL
Hgb urine dipstick: NEGATIVE
Ketones, ur: 20 mg/dL — AB
Leukocytes,Ua: NEGATIVE
Nitrite: NEGATIVE
Protein, ur: NEGATIVE mg/dL
Specific Gravity, Urine: 1.006 (ref 1.005–1.030)
pH: 7 (ref 5.0–8.0)

## 2024-02-23 LAB — COMPREHENSIVE METABOLIC PANEL WITH GFR
ALT: 14 U/L (ref 0–44)
AST: 17 U/L (ref 15–41)
Albumin: 4.2 g/dL (ref 3.5–5.0)
Alkaline Phosphatase: 65 U/L (ref 38–126)
Anion gap: 7 (ref 5–15)
BUN: 9 mg/dL (ref 6–20)
CO2: 23 mmol/L (ref 22–32)
Calcium: 9.1 mg/dL (ref 8.9–10.3)
Chloride: 107 mmol/L (ref 98–111)
Creatinine, Ser: 0.62 mg/dL (ref 0.44–1.00)
GFR, Estimated: >60 mL/min (ref >60)
Glucose, Bld: 87 mg/dL (ref 70–99)
Potassium: 4.1 mmol/L (ref 3.5–5.1)
Sodium: 137 mmol/L (ref 135–145)
Total Bilirubin: 0.9 mg/dL (ref 0.0–1.2)
Total Protein: 7.3 g/dL (ref 6.5–8.1)

## 2024-02-23 LAB — LIPASE, BLOOD: Lipase: 30 U/L (ref 11–51)

## 2024-02-23 LAB — CHLAMYDIA/NGC RT PCR (ARMC ONLY)
Chlamydia Tr: NOT DETECTED
N gonorrhoeae: NOT DETECTED

## 2024-02-23 LAB — CBC
HCT: 38.6 % (ref 36.0–46.0)
Hemoglobin: 12.9 g/dL (ref 12.0–15.0)
MCH: 29.4 pg (ref 26.0–34.0)
MCHC: 33.4 g/dL (ref 30.0–36.0)
MCV: 87.9 fL (ref 80.0–100.0)
Platelets: 203 10*3/uL (ref 150–400)
RBC: 4.39 MIL/uL (ref 3.87–5.11)
RDW: 13.5 % (ref 11.5–15.5)
WBC: 9.2 10*3/uL (ref 4.0–10.5)
nRBC: 0 % (ref 0.0–0.2)

## 2024-02-23 LAB — POC URINE PREG, ED: Preg Test, Ur: NEGATIVE

## 2024-02-23 NOTE — ED Provider Notes (Signed)
 [facility] Provider Note    Event Date/Time   First MD Initiated Contact with Patient 02/23/24 [id number]     (approximate)   History   Vaginal Bleeding   HPI  Monica [name] is a 23 y.o. female who presents today for evaluation of vaginal bleeding.  Patient presents with her mom and boyfriend.  Patient reports that her period has lasted longer than usual and she also has cramping in her low abdomen.  She does not have any vaginal discharge.  She denies nausea or vomiting.  Her mom thought that she was pregnant so patient took a pregnancy test and this was negative.  She denies dizziness or lightheadedness.  No personal or family history of bleeding disorder.  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   Migraine without aura and responsive to treatment 06/12/2015          Physical Exam   Triage Vital Signs: ED Triage Vitals  Encounter Vitals Group     BP 02/23/24 0929 117/76     Systolic BP Percentile --      Diastolic BP Percentile --      Pulse Rate 02/23/24 0929 84     Resp 02/23/24 0929 17     Temp 02/23/24 0926 98.9 F (37.2 C)     Temp Source 02/23/24 0926 Oral     SpO2 02/23/24 0929 99 %     Weight 02/23/24 0938 122 lb 2.2 oz (55.4 kg)     Height 02/23/24 0938 5\' 5"  (1.651 m)     Head Circumference --      Peak Flow --      Pain Score 02/23/24 0929 5     Pain Loc --      Pain Education --      Exclude from Growth Chart --     Most recent vital signs: Vitals:   02/23/24 0926 02/23/24 0929  BP:  117/76  Pulse:  84  Resp:  17  Temp: 98.9 F (37.2 C)   SpO2:  99%    Physical Exam Vitals and nursing note reviewed.  Constitutional:      General: Awake and alert. No acute distress.    Appearance: Normal appearance. The patient is normal weight.  HENT:     Head: Normocephalic and  atraumatic.     Mouth: Mucous membranes are moist.  Eyes:     General: PERRL. Normal EOMs        Right eye: No discharge.        Left eye: No discharge.     Conjunctiva/sclera: Conjunctivae normal.  Cardiovascular:     Rate and Rhythm: Normal rate and regular rhythm.     Pulses: Normal pulses.  Pulmonary:     Effort: Pulmonary effort is normal. No respiratory distress.     Breath sounds: Normal breath sounds.  Abdominal:     Abdomen is soft. There is no abdominal tenderness. No rebound or guarding. No distention. Musculoskeletal:        General: No swelling. Normal range of motion.     Cervical back: Normal range of motion and neck supple.  Skin:    General: Skin is warm and dry.     Capillary Refill: Capillary refill takes less than 2 seconds.     Findings: No rash.  Neurological:  Mental Status: The patient is awake and alert.      ED Results / Procedures / Treatments   Labs (all labs ordered are listed, but only abnormal results are displayed) Labs Reviewed  URINALYSIS, ROUTINE W REFLEX MICROSCOPIC - Abnormal; Notable for the following components:      Result Value   Color, Urine STRAW (*)    APPearance CLEAR (*)    Ketones, ur 20 (*)    All other components within normal limits  WET PREP, GENITAL  CHLAMYDIA/NGC RT PCR ([facility] ONLY)            LIPASE, BLOOD  COMPREHENSIVE METABOLIC PANEL WITH GFR  CBC  POC URINE PREG, ED     EKG     RADIOLOGY I independently reviewed and interpreted imaging and agree with radiologists findings.     PROCEDURES:  Critical Care performed:   Procedures   MEDICATIONS ORDERED IN ED: Medications - No data to display   IMPRESSION / MDM / ASSESSMENT AND PLAN / ED COURSE  I reviewed the triage vital signs and the nursing notes.   Differential diagnosis includes, but is not limited to, abnormal/dysfunctional uterine bleeding, pregnancy, miscarriage, anemia, fibroid.  I reviewed the patient's chart.  Patient is  followed by [facility], normally has regular menses every 28 to 30 days lasting 7 days with mild dysmenorrhea.  Patient is awake and alert, hemodynamically stable and afebrile.  She is nontoxic in appearance.  Labs obtained are overall reassuring, stable H&H.  Pregnancy is negative.  Ultrasound obtained is normal as well.  I recommended close outpatient follow-up with her OB/GYN and we also discussed return precautions.  Patient is reassured by these results.  She was discharged in stable condition.  Patient's presentation is most consistent with acute complicated illness / injury requiring diagnostic workup.      FINAL CLINICAL IMPRESSION(S) / ED DIAGNOSES   Final diagnoses:  Vaginal bleeding     Rx / DC Orders   ED Discharge Orders     None        Note:  This document was prepared using Dragon voice recognition software and may include unintentional dictation errors.   Delita Chiquito E, PA-C 02/23/24 1443    Collis Deaner, MD 02/26/24 [phone]

## 2024-02-23 NOTE — ED Triage Notes (Signed)
 Pt here with vaginal bleeding. Pt having a lot of abd pain and intermittent cramping. Pt also has been having severe bleeding that comes and goes. Pt has been having negative pregnancy test. Pt denies fever, nausea, or vomiting.

## 2024-02-23 NOTE — Discharge Instructions (Signed)
 Your blood work, swabs, urinalysis, and ultrasound are normal.  Please follow-up with your outpatient provider.  Please return for any new, worsening, or change in symptoms or other concerns.

## 2024-03-08 ENCOUNTER — Ambulatory Visit

## 2024-03-08 VITALS — BP 109/73 | HR 81 | Wt 126.0 lb

## 2024-03-08 DIAGNOSIS — N926 Irregular menstruation, unspecified: Secondary | ICD-10-CM

## 2024-03-08 DIAGNOSIS — Z308 Encounter for other contraceptive management: Secondary | ICD-10-CM

## 2024-03-08 MED ORDER — LO LOESTRIN FE 1 MG-10 MCG / 10 MCG PO TABS: 1.0000 | ORAL_TABLET | Freq: Every day | ORAL | 11 refills | Status: AC

## 2024-03-08 MED ORDER — LEVONORGESTREL-ETHINYL ESTRAD 90-20 MCG PO TABS: 1.0000 | ORAL_TABLET | Freq: Every day | ORAL | 11 refills | Status: DC

## 2024-03-08 NOTE — Progress Notes (Addendum)
   GYN ENCOUNTER  Encounter for Menorrhagia   Subjective  HPI: Monica [name] is a 23 y.o. G0P0000 who presents today for heavy menstrual bleeding that has been ongoing for the past two months. She reports breakthrough bleeding, along with increased heaviness of her menstrual cycle. She reports passing blood clots, and having to change her tampon every two hours.  She has been on a COC for the past two years after having her Nexplanon  removed d/t menorrhagia. 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 She reports being consistent with her pills at the same time every day.   Past Medical History:  Diagnosis Date   Dextrocardia    Migraines    Situs inversus abdominalis    Past Surgical History:  Procedure Laterality Date   OTHER SURGICAL HISTORY     Arm Surgery   OB History     Gravida  0   Para  0   Term  0   Preterm  0   AB  0   Living  0      SAB  0   IAB  0   Ectopic  0   Multiple  0   Live Births  0          No Known Allergies  Constitutional: Denied constitutional symptoms, night sweats, recent illness, fatigue, fever, insomnia and weight loss.  Eyes: Denied eye symptoms, eye pain, photophobia, vision change and visual disturbance.  Ears/Nose/Throat/Neck: Denied ear, nose, throat or neck symptoms, hearing loss, nasal discharge, sinus congestion and sore throat.  Cardiovascular: Denied cardiovascular symptoms, arrhythmia, chest pain/pressure, edema, exercise intolerance, orthopnea and palpitations.  Respiratory: Denied pulmonary symptoms, asthma, pleuritic pain, productive sputum, cough, dyspnea and wheezing.  Gastrointestinal: Denied, gastro-esophageal reflux, melena, nausea and vomiting.  Genitourinary: Denied genitourinary symptoms including symptomatic vaginal discharge, pelvic relaxation issues, and urinary complaints.  Musculoskeletal: Denied musculoskeletal symptoms, stiffness, swelling, muscle  weakness and myalgia.  Dermatologic: Denied dermatology symptoms, rash and scar.  Neurologic: Denied neurology symptoms, dizziness, headache, neck pain and syncope.  Psychiatric: Denied psychiatric symptoms, anxiety and depression.  Endocrine: Denied endocrine symptoms including hot flashes and night sweats.    Objective  BP 109/73   Pulse 81   Wt 57.2 kg   LMP 03/05/2024 (Exact Date)   BMI 20.97 kg/m   Physical examination   Pelvic: deferred  Vulva:   Vagina:   Support:   Urethra   Meatus   Cervix:   Anus:   Perineum:         Bimanual Deferred   Uterus:   Adnexae:   Cul-de-sac:     Assessment/ Plan -Assessment: Irregular menstrual bleeding.  Discussed how lifestyle changes such as stress can effect  heavy bleeding and breakthrough bleeding. Discussed options such as changing OCP, or changing birth control method to a Mirena  IUD. Pt desires to switch to a different type of OCP and see if that helps decrease bleeding. Reviewed it may take 2-3 cycles to see changes/improvement. Pt understands and will follow up as needed.   Rosina Hamilton, SNM

## 2024-03-13 ENCOUNTER — Ambulatory Visit

## 2024-04-07 ENCOUNTER — Other Ambulatory Visit: Payer: Self-pay | Admitting: Obstetrics and Gynecology

## 2024-04-07 DIAGNOSIS — Z3041 Encounter for surveillance of contraceptive pills: Secondary | ICD-10-CM

## 2024-09-08 ENCOUNTER — Ambulatory Visit
